# Patient Record
Sex: Female | Born: 1980 | Race: White | Hispanic: No | Marital: Married | Smoking: Never smoker
Health system: Southern US, Community
[De-identification: ages and names within clinical notes are randomized; demographics above are authoritative.]

## PROBLEM LIST (undated history)

## (undated) DIAGNOSIS — I10 Essential (primary) hypertension: Secondary | ICD-10-CM

## (undated) DIAGNOSIS — F419 Anxiety disorder, unspecified: Secondary | ICD-10-CM

## (undated) DIAGNOSIS — D509 Iron deficiency anemia, unspecified: Secondary | ICD-10-CM

## (undated) DIAGNOSIS — E282 Polycystic ovarian syndrome: Secondary | ICD-10-CM

## (undated) DIAGNOSIS — G4733 Obstructive sleep apnea (adult) (pediatric): Secondary | ICD-10-CM

## (undated) HISTORY — PX: LAPAROSCOPIC OOPHERECTOMY: SHX6507

## (undated) HISTORY — DX: Essential (primary) hypertension: I10

## (undated) HISTORY — DX: Obstructive sleep apnea (adult) (pediatric): G47.33

## (undated) HISTORY — PX: LAPAROSCOPIC HYSTERECTOMY: SHX1926

## (undated) HISTORY — DX: Anxiety disorder, unspecified: F41.9

## (undated) HISTORY — PX: LAPAROSCOPY: SHX197

## (undated) HISTORY — PX: NASAL SEPTUM SURGERY: SHX37

---

## 2011-11-07 ENCOUNTER — Emergency Department (HOSPITAL_COMMUNITY): Payer: PRIVATE HEALTH INSURANCE

## 2011-11-07 ENCOUNTER — Emergency Department (HOSPITAL_COMMUNITY)
Admission: EM | Admit: 2011-11-07 | Discharge: 2011-11-07 | Disposition: A | Discharge status: Home / Self Care | Payer: PRIVATE HEALTH INSURANCE | Attending: Emergency Medicine | Admitting: Emergency Medicine

## 2011-11-07 DIAGNOSIS — R059 Cough, unspecified: Secondary | ICD-10-CM | POA: Insufficient documentation

## 2011-11-07 DIAGNOSIS — J3489 Other specified disorders of nose and nasal sinuses: Secondary | ICD-10-CM | POA: Insufficient documentation

## 2011-11-07 DIAGNOSIS — H9209 Otalgia, unspecified ear: Secondary | ICD-10-CM | POA: Insufficient documentation

## 2011-11-07 DIAGNOSIS — R079 Chest pain, unspecified: Secondary | ICD-10-CM | POA: Insufficient documentation

## 2011-11-07 DIAGNOSIS — J069 Acute upper respiratory infection, unspecified: Secondary | ICD-10-CM | POA: Insufficient documentation

## 2011-11-07 DIAGNOSIS — IMO0001 Reserved for inherently not codable concepts without codable children: Secondary | ICD-10-CM | POA: Insufficient documentation

## 2011-11-07 DIAGNOSIS — M549 Dorsalgia, unspecified: Secondary | ICD-10-CM | POA: Insufficient documentation

## 2011-11-07 DIAGNOSIS — R070 Pain in throat: Secondary | ICD-10-CM | POA: Insufficient documentation

## 2011-11-07 DIAGNOSIS — R51 Headache: Secondary | ICD-10-CM | POA: Insufficient documentation

## 2011-11-07 DIAGNOSIS — R05 Cough: Secondary | ICD-10-CM | POA: Insufficient documentation

## 2011-11-07 LAB — POCT PREGNANCY, URINE: Preg Test, Ur: NEGATIVE

## 2011-11-07 MED ORDER — GUAIFENESIN-CODEINE 100-10 MG/5ML PO SYRP: ORAL_SOLUTION | ORAL | Status: DC

## 2011-11-07 NOTE — ED Provider Notes (Signed)
History     CSN: [id number]  Arrival date & time 11/07/11  1537   First MD Initiated Contact with Patient 11/07/11 1754      Chief Complaint  Patient presents with  . Nasal Congestion  . Cough  . Back Pain  . Chest Pain  . Sore Throat  . Otalgia  . Generalized Body Aches    (Consider location/radiation/quality/duration/timing/severity/associated sxs/prior treatment) Patient is a 31 y.o. female presenting with cough, back pain, chest pain, pharyngitis, and ear pain. The history is provided by the patient. No language interpreter was used.  Cough This is a new problem. The current episode started 2 days ago. The problem occurs every few minutes. The problem has been gradually worsening. The cough is productive of sputum. Associated symptoms include chest pain, ear pain, headaches, sore throat and myalgias. Pertinent negatives include no wheezing.  Back Pain  Associated symptoms include chest pain and headaches.  Chest Pain Primary symptoms include cough. Pertinent negatives for primary symptoms include no wheezing.    Sore Throat Associated symptoms include chest pain, coughing, headaches, myalgias and a sore throat.  Otalgia Associated symptoms include headaches, sore throat and cough.    History reviewed. No pertinent past medical history.  History reviewed. No pertinent past surgical history.  No family history on file.  History  Substance Use Topics  . Smoking status: Never Smoker   . Smokeless tobacco: Not on file  . Alcohol Use: No    OB History    Grav Para Term Preterm Abortions TAB SAB Ect Mult Living                  Review of Systems  HENT: Positive for ear pain and sore throat.   Respiratory: Positive for cough. Negative for wheezing.   Cardiovascular: Positive for chest pain.  Musculoskeletal: Positive for myalgias and back pain.  Neurological: Positive for headaches.    Allergies  Latex  Home Medications  No current outpatient  prescriptions on file.  BP 133/91  Pulse 88  Temp(Src) 98 F (36.7 C) (Oral)  Resp 20  Ht 5\' 9"  (1.753 m)  Wt 255 lb (115.667 kg)  BMI 37.66 kg/m2  SpO2 100%  LMP 10/25/2011  Physical Exam  Nursing note and vitals reviewed. Constitutional: She is oriented to person, place, and time. She appears well-developed and well-nourished. She is cooperative. No distress.  HENT:  Head: Normocephalic and atraumatic.  Right Ear: External ear normal.  Left Ear: External ear normal.  Mouth/Throat: Mucous membranes are normal. No dental abscesses or uvula swelling. Posterior oropharyngeal erythema present. No oropharyngeal exudate or tonsillar abscesses.  Eyes: EOM are normal.  Neck: Normal range of motion.  Cardiovascular: Normal rate, regular rhythm and normal heart sounds.   Pulmonary/Chest: Effort normal and breath sounds normal. No accessory muscle usage. No respiratory distress. She has no decreased breath sounds. She has no wheezes. She has no rhonchi. She has no rales.  Abdominal: Soft. She exhibits no distension. There is no tenderness.  Musculoskeletal: Normal range of motion.       Arms: Neurological: She is alert and oriented to person, place, and time.  Skin: Skin is warm and dry.  Psychiatric: She has a normal mood and affect. Judgment normal.    ED Course  Procedures (including critical care time)  Labs Reviewed - No data to display No results found.   No diagnosis found.    MDM          Duke Salvia  [place], [place] 11/07/11 1857

## 2011-11-07 NOTE — Discharge Instructions (Signed)
Antibiotic Nonuse  Your caregiver felt that the infection or problem was not one that would be helped with an antibiotic. Infections may be caused by viruses or bacteria. Only a caregiver can tell which one of these is the likely cause of an illness. A cold is the most common cause of infection in both adults and children. A cold is a virus. Antibiotic treatment will have no effect on a viral infection. Viruses can lead to many lost days of work caring for sick children and many missed days of school. Children may catch as many as 10 "colds" or "flus" per year during which they can be tearful, cranky, and uncomfortable. The goal of treating a virus is aimed at keeping the ill person comfortable. Antibiotics are medications used to help the body fight bacterial infections. There are relatively few types of bacteria that cause infections but there are hundreds of viruses. While both viruses and bacteria cause infection they are very different types of germs. A viral infection will typically go away by itself within 7 to 10 days. Bacterial infections may spread or get worse without antibiotic treatment. Examples of bacterial infections are:  Sore throats (like strep throat or tonsillitis).   Infection in the lung (pneumonia).   Ear and skin infections.  Examples of viral infections are:  Colds or flus.   Most coughs and bronchitis.   Sore throats not caused by Strep.   Runny noses.  It is often best not to take an antibiotic when a viral infection is the cause of the problem. Antibiotics can kill off the helpful bacteria that we have inside our body and allow harmful bacteria to start growing. Antibiotics can cause side effects such as allergies, nausea, and diarrhea without helping to improve the symptoms of the viral infection. Additionally, repeated uses of antibiotics can cause bacteria inside of our body to become resistant. That resistance can be passed onto harmful bacterial. The next time  you have an infection it may be harder to treat if antibiotics are used when they are not needed. Not treating with antibiotics allows our own immune system to develop and take care of infections more efficiently. Also, antibiotics will work better for [place] when they are prescribed for bacterial infections. Treatments for a child that is ill may include:  Give extra fluids throughout the day to stay hydrated.   Get plenty of rest.   Only give your child over-the-counter or prescription medicines for pain, discomfort, or fever as directed by your caregiver.   The use of a cool mist humidifier may help stuffy noses.   Cold medications if suggested by your caregiver.  Your caregiver may decide to start you on an antibiotic if:  The problem you were seen for today continues for a longer length of time than expected.   You develop a secondary bacterial infection.  SEEK MEDICAL CARE IF:  Fever lasts longer than 5 days.   Symptoms continue to get worse after 5 to 7 days or become severe.   Difficulty in breathing develops.   Signs of dehydration develop (poor drinking, rare urinating, dark colored urine).   Changes in behavior or worsening tiredness (listlessness or lethargy).  Document Released: 12/27/2001 Document Revised: 06/30/2011 Document Reviewed: 06/25/2009 [facility] Patient Information 2012 [place], [place].Upper Respiratory Infection, Adult An upper respiratory infection (URI) is also known as the common cold. It is often caused by a type of germ (virus). Colds are easily spread (contagious). You can pass it to others by  kissing, coughing, sneezing, or drinking out of the same glass. Usually, you get better in 1 or 2 weeks.  HOME CARE   Only take medicine as told by your doctor.   Use a warm mist humidifier or breathe in steam from a hot shower.   Drink enough water and fluids to keep your pee (urine) clear or pale yellow.   Get plenty of rest.   Return to work when your  temperature is back to normal or as told by your doctor. You may use a face mask and wash your hands to stop your cold from spreading.  GET HELP RIGHT AWAY IF:   After the first few days, you feel you are getting worse.   You have questions about your medicine.   You have chills, shortness of breath, or brown or red spit (mucus).   You have yellow or brown snot (nasal discharge) or pain in the face, especially when you bend forward.   You have a fever, puffy (swollen) neck, pain when you swallow, or white spots in the back of your throat.   You have a bad headache, ear pain, sinus pain, or chest pain.   You have a high-pitched whistling sound when you breathe in and out (wheezing).   You have a lasting cough or cough up blood.   You have sore muscles or a stiff neck.  MAKE SURE YOU:   Understand these instructions.   Will watch your condition.   Will get help right away if you are not doing well or get worse.  Document Released: 04/05/2008 Document Revised: 06/30/2011 Document Reviewed: 02/22/2011 [facility] Patient Information 2012 [place], [place].    Take the cough medicine as directed.  Take tylenol up to 1000 mg every 4 hrs or ibuprofen up to 800 mg every 8 hrs for fever or discomfort.  Follow up with your MD as needed.

## 2011-11-07 NOTE — ED Notes (Signed)
Pt presents with nasal and chest congestion, cough, sore throat, ear pain, body aches, and back/ chest pain when she coughs since Friday.

## 2011-11-09 NOTE — ED Provider Notes (Signed)
Medical screening examination/treatment/procedure(s) were performed by non-physician practitioner and as supervising physician I was immediately available for consultation/collaboration.  Vickie Ruben S. Malikiah Debarr, MD 11/09/11 1000 

## 2013-09-09 ENCOUNTER — Emergency Department (HOSPITAL_COMMUNITY)
Admission: EM | Admit: 2013-09-09 | Discharge: 2013-09-09 | Disposition: A | Discharge status: Home / Self Care | Payer: BC Managed Care – PPO | Attending: Emergency Medicine | Admitting: Emergency Medicine

## 2013-09-09 ENCOUNTER — Encounter (HOSPITAL_COMMUNITY): Payer: Self-pay | Admitting: Emergency Medicine

## 2013-09-09 ENCOUNTER — Emergency Department (HOSPITAL_COMMUNITY): Payer: BC Managed Care – PPO

## 2013-09-09 DIAGNOSIS — R000 Tachycardia, unspecified: Secondary | ICD-10-CM | POA: Insufficient documentation

## 2013-09-09 DIAGNOSIS — N946 Dysmenorrhea, unspecified: Secondary | ICD-10-CM

## 2013-09-09 DIAGNOSIS — Z862 Personal history of diseases of the blood and blood-forming organs and certain disorders involving the immune mechanism: Secondary | ICD-10-CM | POA: Insufficient documentation

## 2013-09-09 DIAGNOSIS — Z91040 Latex allergy status: Secondary | ICD-10-CM | POA: Insufficient documentation

## 2013-09-09 DIAGNOSIS — Z3202 Encounter for pregnancy test, result negative: Secondary | ICD-10-CM | POA: Insufficient documentation

## 2013-09-09 DIAGNOSIS — Z79899 Other long term (current) drug therapy: Secondary | ICD-10-CM | POA: Insufficient documentation

## 2013-09-09 DIAGNOSIS — Z792 Long term (current) use of antibiotics: Secondary | ICD-10-CM | POA: Insufficient documentation

## 2013-09-09 DIAGNOSIS — R197 Diarrhea, unspecified: Secondary | ICD-10-CM | POA: Insufficient documentation

## 2013-09-09 HISTORY — DX: Iron deficiency anemia, unspecified: D50.9

## 2013-09-09 LAB — URINALYSIS, ROUTINE W REFLEX MICROSCOPIC
Bilirubin Urine: NEGATIVE
Glucose, UA: NEGATIVE mg/dL
Ketones, ur: NEGATIVE mg/dL
pH: 5.5 (ref 5.0–8.0)

## 2013-09-09 LAB — URINE MICROSCOPIC-ADD ON

## 2013-09-09 LAB — CBC WITH DIFFERENTIAL/PLATELET
Eosinophils Absolute: 0.2 10*3/uL (ref 0.0–0.7)
Hemoglobin: 11.8 g/dL — ABNORMAL LOW (ref 12.0–15.0)
Lymphs Abs: 2.7 10*3/uL (ref 0.7–4.0)
MCH: 26.7 pg (ref 26.0–34.0)
Neutro Abs: 6 10*3/uL (ref 1.7–7.7)
Neutrophils Relative %: 64 % (ref 43–77)
Platelets: 335 10*3/uL (ref 150–400)
RBC: 4.42 MIL/uL (ref 3.87–5.11)
WBC: 9.4 10*3/uL (ref 4.0–10.5)

## 2013-09-09 LAB — COMPREHENSIVE METABOLIC PANEL
ALT: 15 U/L (ref 0–35)
Albumin: 3.6 g/dL (ref 3.5–5.2)
Alkaline Phosphatase: 78 U/L (ref 39–117)
Chloride: 102 mEq/L (ref 96–112)
Glucose, Bld: 96 mg/dL (ref 70–99)
Potassium: 3.8 mEq/L (ref 3.5–5.1)
Sodium: 137 mEq/L (ref 135–145)
Total Bilirubin: 0.2 mg/dL — ABNORMAL LOW (ref 0.3–1.2)
Total Protein: 7.5 g/dL (ref 6.0–8.3)

## 2013-09-09 MED ORDER — SODIUM CHLORIDE 0.9 % IV BOLUS (SEPSIS)
1000.0000 mL | Freq: Once | INTRAVENOUS | Status: AC
  Administered 2013-09-09: 1000 mL via INTRAVENOUS

## 2013-09-09 NOTE — ED Provider Notes (Signed)
CSN: [id number]     Arrival date & time 09/09/13  1901 History   First MD Initiated Contact with Patient 09/09/13 1959     Chief Complaint  Patient presents with  . Vaginal Bleeding   (Consider location/radiation/quality/duration/timing/severity/associated sxs/prior Treatment) HPI Comments: Patient reports, one week of heavy menstrual bleeding associated with clots.  She also has been taking antibiotic for the past, week for sinus infection, and is developed diarrhea.  She is unsure whether the abdominal pain is from the, diarrhea, or from her menstrual cycle.  She is currently taking birth control pills for the past 10 months.  She recently has been married and started having intercourse on a regular basis she denies any dysuria, or vaginal discharge.  Prior to the vaginal bleeding.  She has not contacted her OB/GYN for the symptoms. Denies any change in her appetite, dizziness, or symptoms of anemia, such as rapid heart rate, or weakness  Patient is a 32 y.o. female presenting with vaginal bleeding. The history is provided by the patient.  Vaginal Bleeding Quality:  Clots Severity:  Moderate Onset quality:  Gradual Duration:  1 week Timing:  Intermittent Progression:  Unchanged Chronicity:  New Menstrual history:  Regular Number of pads used:  One pad every 3-4, hours Possible pregnancy: no   Context comment:  Normal menstrual cycle Relieved by:  Nothing Worsened by:  Nothing tried Associated symptoms: abdominal pain   Associated symptoms: no dizziness, no dysuria, no fever and no vaginal discharge   Associated symptoms comment:  Diarrhea Risk factors: new sexual partner   Risk factors: no bleeding disorder, no hx of ectopic pregnancy, no hx of endometriosis, no ovarian cysts and no prior miscarriage     Past Medical History  Diagnosis Date  . Iron deficiency anemia    Past Surgical History  Procedure Laterality Date  . Laparoscopy     No family history on file. History   Substance Use Topics  . Smoking status: Never Smoker   . Smokeless tobacco: Not on file  . Alcohol Use: No   OB History   Grav Para Term Preterm Abortions TAB SAB Ect Mult Living                 Review of Systems  Constitutional: Negative for fever and chills.  Respiratory: Negative for shortness of breath.   Cardiovascular: Negative for leg swelling.  Gastrointestinal: Positive for abdominal pain and diarrhea.  Genitourinary: Positive for vaginal bleeding. Negative for dysuria, flank pain, vaginal discharge and vaginal pain.  Neurological: Negative for dizziness and headaches.  All other systems reviewed and are negative.    Allergies  Latex  Home Medications   Current Outpatient Rx  Name  Route  Sig  Dispense  Refill  . amoxicillin (AMOXIL) 500 MG capsule   Oral   Take 500 mg by mouth 2 (two) times daily.         Marland Kitchen ibuprofen (ADVIL,MOTRIN) 200 MG tablet   Oral   Take 800 mg by mouth every 6 (six) hours as needed for moderate pain.         Marland Kitchen loratadine (ALLERGY RELIEF) 10 MG tablet   Oral   Take 10 mg by mouth daily as needed for allergies.         . Multiple Vitamin (MULTIVITAMIN WITH MINERALS) TABS tablet   Oral   Take 1 tablet by mouth daily.         Lorita Officer Triphasic (ORTHO TRI-CYCLEN, 28, PO)  Oral   Take 1 tablet by mouth daily.          BP 137/89  Pulse 85  Temp(Src) 97.9 F (36.6 C) (Oral)  Resp 20  Wt 307 lb (139.254 kg)  SpO2 99%  LMP 09/02/2013 Physical Exam  Nursing note and vitals reviewed. Constitutional: She appears well-developed and well-nourished.  HENT:  Head: Normocephalic.  Eyes: Pupils are equal, round, and reactive to light.  Neck: Normal range of motion.  Cardiovascular: Regular rhythm.  Tachycardia present.   Pulmonary/Chest: Effort normal and breath sounds normal.  Abdominal: Soft. She exhibits no distension. There is no tenderness.  Abdominal exam difficult to do, to body habitus/morbid obesity   Genitourinary: Uterus is deviated. Uterus is not tender. Cervix exhibits no discharge. Right adnexum displays no tenderness. Left adnexum displays no tenderness.  Cervical os appears to be open approximately one fingertip.  Bright red blood and clots in the vaginal vault  Musculoskeletal: Normal range of motion.  Neurological: She is alert.  Skin: Skin is warm.    ED Course  Procedures (including critical care time) Labs Review Labs Reviewed  CBC WITH DIFFERENTIAL - Abnormal; Notable for the following:    Hemoglobin 11.8 (*)    All other components within normal limits  COMPREHENSIVE METABOLIC PANEL - Abnormal; Notable for the following:    Total Bilirubin 0.2 (*)    All other components within normal limits  URINALYSIS, ROUTINE W REFLEX MICROSCOPIC - Abnormal; Notable for the following:    Color, Urine AMBER (*)    APPearance CLOUDY (*)    Hgb urine dipstick LARGE (*)    Leukocytes, UA TRACE (*)    All other components within normal limits  URINE MICROSCOPIC-ADD ON  POCT PREGNANCY, URINE   Imaging Review US Transvaginal Non-ob  09/09/2013   CLINICAL DATA:  Vaginal bleeding.  EXAM: TRANSABDOMINAL AND TRANSVAGINAL ULTRASOUND OF PELVIS  TECHNIQUE: Both transabdominal and transvaginal ultrasound examinations of the pelvis were performed. Transabdominal technique was performed for global imaging of the pelvis including uterus, ovaries, adnexal regions, and pelvic cul-de-sac. It was necessary to proceed with endovaginal exam following the transabdominal exam to visualize the uterus and ovaries in better detail.  COMPARISON:  None  FINDINGS: Uterus  Measurements: 9.6 x 6.0 x 4.6 cm. No fibroids or other mass visualized.  Endometrium  Thickness: 3.2 mm.  No focal abnormality visualized.  Right ovary  Measurements: 6.1 x 4.9 x 3.7 cm. 4.9 cm simple appearing cyst.  Left ovary  Measurements: 4.1 x 1.9 x 1.8 cm. Normal appearance/no adnexal mass.  Other findings  Trace amount of free peritoneal  fluid, within normal limits of physiological fluid.  IMPRESSION: 4.9 cm simple appearing right ovarian cyst. This is almost certainly benign, and no specific imaging follow up is recommended according to the Society of Radiologists in Ultrasound 2010 Consensus Conference Statement (D Lenis Noon et al. Management of Asymptomatic Ovarian and Other Adnexal Cysts Imaged at [place]: Society of Radiologists in Ultrasound Consensus Conference Statement 2010. Radiology 256 (Sept 2010): 943-954.). Otherwise, unremarkable examination.   Electronically Signed   By: Gordan Payment M.D.   On: 09/09/2013 22:03   US Pelvis Complete  09/09/2013   CLINICAL DATA:  Vaginal bleeding.  EXAM: TRANSABDOMINAL AND TRANSVAGINAL ULTRASOUND OF PELVIS  TECHNIQUE: Both transabdominal and transvaginal ultrasound examinations of the pelvis were performed. Transabdominal technique was performed for global imaging of the pelvis including uterus, ovaries, adnexal regions, and pelvic cul-de-sac. It was necessary to proceed with endovaginal exam following  the transabdominal exam to visualize the uterus and ovaries in better detail.  COMPARISON:  None  FINDINGS: Uterus  Measurements: 9.6 x 6.0 x 4.6 cm. No fibroids or other mass visualized.  Endometrium  Thickness: 3.2 mm.  No focal abnormality visualized.  Right ovary  Measurements: 6.1 x 4.9 x 3.7 cm. 4.9 cm simple appearing cyst.  Left ovary  Measurements: 4.1 x 1.9 x 1.8 cm. Normal appearance/no adnexal mass.  Other findings  Trace amount of free peritoneal fluid, within normal limits of physiological fluid.  IMPRESSION: 4.9 cm simple appearing right ovarian cyst. This is almost certainly benign, and no specific imaging follow up is recommended according to the Society of Radiologists in Ultrasound 2010 Consensus Conference Statement (D Lenis Noon et al. Management of Asymptomatic Ovarian and Other Adnexal Cysts Imaged at [place]: Society of Radiologists in Ultrasound Consensus Conference Statement 2010. Radiology  256 (Sept 2010): 943-954.). Otherwise, unremarkable examination.   Electronically Signed   By: Gordan Payment M.D.   On: 09/09/2013 22:03    EKG Interpretation   None       MDM   1. Diarrhea   2. Dysmenorrhea     Hemoglobin and hematocrit within normal range.  White count.  Does not reveal any infection.  Will obtain ultrasound due to patient's bleeding and cervical os being open  She was given a liter of fluid.  Her ultrasound was reviewed and is continue taking her birth control.  Warm to add probiotic to her diary Carney Bern while she completes her antibiotic, and to make an appointment with her primary care OB/GYN for further evaluation.  As needed  Arman Filter, NP 09/09/13 2211

## 2013-09-09 NOTE — ED Notes (Signed)
Patient transported to Ultrasound 

## 2013-09-09 NOTE — ED Notes (Signed)
Pt. reports heavy menstrual bleeding with clots and low abdominal cramping for 1 week .

## 2013-09-09 NOTE — ED Notes (Signed)
Schulz, NP at bedside.  

## 2013-09-09 NOTE — ED Notes (Signed)
Patient returned from Ultrasound. 

## 2013-09-10 NOTE — ED Provider Notes (Signed)
Medical screening examination/treatment/procedure(s) were performed by non-physician practitioner and as supervising physician I was immediately available for consultation/collaboration.  EKG Interpretation   None         Darlys Gales, MD 09/10/13 [phone]

## 2015-07-14 ENCOUNTER — Other Ambulatory Visit (HOSPITAL_COMMUNITY): Payer: Self-pay | Admitting: Obstetrics & Gynecology

## 2015-07-14 DIAGNOSIS — Z3141 Encounter for fertility testing: Secondary | ICD-10-CM

## 2015-07-18 ENCOUNTER — Ambulatory Visit (HOSPITAL_COMMUNITY): Payer: PRIVATE HEALTH INSURANCE

## 2015-11-30 ENCOUNTER — Inpatient Hospital Stay (HOSPITAL_COMMUNITY)
Admission: AD | Admit: 2015-11-30 | Discharge: 2015-12-01 | Disposition: A | Discharge status: Home / Self Care | Payer: BLUE CROSS/BLUE SHIELD | Source: Ambulatory Visit | Attending: Obstetrics and Gynecology | Admitting: Obstetrics and Gynecology

## 2015-11-30 DIAGNOSIS — R030 Elevated blood-pressure reading, without diagnosis of hypertension: Secondary | ICD-10-CM | POA: Insufficient documentation

## 2015-11-30 DIAGNOSIS — N939 Abnormal uterine and vaginal bleeding, unspecified: Secondary | ICD-10-CM | POA: Diagnosis present

## 2015-11-30 DIAGNOSIS — E282 Polycystic ovarian syndrome: Secondary | ICD-10-CM | POA: Insufficient documentation

## 2015-11-30 HISTORY — DX: Polycystic ovarian syndrome: E28.2

## 2015-11-30 LAB — CBC
HCT: 37 % (ref 36.0–46.0)
Hemoglobin: 11.5 g/dL — ABNORMAL LOW (ref 12.0–15.0)
MCH: 25.4 pg — AB (ref 26.0–34.0)
MCHC: 31.1 g/dL (ref 30.0–36.0)
MCV: 81.9 fL (ref 78.0–100.0)
PLATELETS: 267 10*3/uL (ref 150–400)
RBC: 4.52 MIL/uL (ref 3.87–5.11)
RDW: 15.8 % — ABNORMAL HIGH (ref 11.5–15.5)
WBC: 7.8 10*3/uL (ref 4.0–10.5)

## 2015-11-30 LAB — URINALYSIS, ROUTINE W REFLEX MICROSCOPIC
BILIRUBIN URINE: NEGATIVE
GLUCOSE, UA: NEGATIVE mg/dL
KETONES UR: 15 mg/dL — AB
Nitrite: NEGATIVE
PH: 5.5 (ref 5.0–8.0)
Protein, ur: NEGATIVE mg/dL
SPECIFIC GRAVITY, URINE: 1.025 (ref 1.005–1.030)

## 2015-11-30 LAB — URINE MICROSCOPIC-ADD ON

## 2015-11-30 LAB — POCT PREGNANCY, URINE: PREG TEST UR: NEGATIVE

## 2015-11-30 NOTE — MAU Note (Signed)
Pt states that she has PCOS and started having heavy vaginal bleeding. Takes provera. States the bleeding is much worse than it normally is and she is passing some clots. Wears double pads and states that she changes them 6-7 times/day. Taking iron supplement.

## 2015-12-01 ENCOUNTER — Encounter (HOSPITAL_COMMUNITY): Payer: Self-pay

## 2015-12-01 DIAGNOSIS — N939 Abnormal uterine and vaginal bleeding, unspecified: Secondary | ICD-10-CM | POA: Diagnosis not present

## 2015-12-01 NOTE — Discharge Instructions (Signed)

## 2015-12-01 NOTE — MAU Provider Note (Signed)
History   [id number]   Chief Complaint  Patient presents with  . Vaginal Bleeding    HPI Vickie Mason is a 35 y.o. female  [id number] here with report of heavy vaginal bleeding x 2 weeks.  Pt states that she has PCOS and takes provera 10 mg daily to help with the bleeding. Patient reports that the bleeding is much worse than it normally is and she is passing some clots. + uterine cramping.  Wears double pads and states that she changes them 6-7 times/day. Taking iron supplement.  +fatigue, denies dizziness, chest pain, or palpitations.   Pt declines pain medication at this time.    Patient's last menstrual period was 11/17/2015.  OB History  Gravida Para Term Preterm AB SAB TAB Ectopic Multiple Living         Past Medical History  Diagnosis Date  . Iron deficiency anemia   . PCOS (polycystic ovarian syndrome)     No family history on file.  Social History   Social History  . Marital Status: Married    Spouse Name: N/A  . Number of Children: N/A  . Years of Education: N/A   Social History Main Topics  . Smoking status: Never Smoker   . Smokeless tobacco: None  . Alcohol Use: No  . Drug Use: No  . Sexual Activity: Yes    Birth Control/ Protection: None   Other Topics Concern  . None   Social History Narrative    Allergies  Allergen Reactions  . Latex Dermatitis    No current facility-administered medications on file prior to encounter.   Current Outpatient Prescriptions on File Prior to Encounter  Medication Sig Dispense Refill  . amoxicillin (AMOXIL) 500 MG capsule Take 500 mg by mouth 2 (two) times daily.    Marland Kitchen ibuprofen (ADVIL,MOTRIN) 200 MG tablet Take 800 mg by mouth every 6 (six) hours as needed for moderate pain.    Marland Kitchen loratadine (ALLERGY RELIEF) 10 MG tablet Take 10 mg by mouth daily as needed for allergies.    . Multiple Vitamin (MULTIVITAMIN WITH MINERALS) TABS tablet Take 1 tablet by mouth daily.    Lorita Officer  Triphasic (ORTHO TRI-CYCLEN, 28, PO) Take 1 tablet by mouth daily.       Review of Systems  Constitutional: Positive for fatigue. Negative for fever and chills.  Cardiovascular: Negative for chest pain.  Genitourinary: Positive for vaginal bleeding and pelvic pain.  Neurological: Negative for dizziness, syncope and light-headedness.  All other systems reviewed and are negative.    Physical Exam   Filed Vitals:   11/30/15 2305  BP: 160/82  Pulse: 73  Temp: 98.1 F (36.7 C)  Resp: 20  Height:  (1.727 m)  Weight: 154.495 kg (340 lb 9.6 oz)  SpO2: 99%    Physical Exam  Constitutional: She is oriented to person, place, and time. She appears well-developed and well-nourished.  HENT:  Head: Normocephalic.  Neck: Normal range of motion. Neck supple.  Cardiovascular: Normal rate, regular rhythm and normal heart sounds.   Respiratory: Effort normal and breath sounds normal.  GI: Soft. She exhibits no mass. There is tenderness. There is no guarding.  Genitourinary: There is bleeding (small amount of bleeding; negative clots ) in the vagina.  Neurological: She is alert and oriented to person, place, and time. She has normal reflexes.  Skin: Skin is warm and dry.    MAU Course  Procedures  MDM Results for orders placed or performed during the hospital encounter of 11/30/15 (from the past 24 hour(s))  Urinalysis, Routine w reflex microscopic (not at [facility])     Status: Abnormal   Collection Time: 11/30/15 11:11 PM  Result Value Ref Range   Color, Urine YELLOW YELLOW   APPearance CLOUDY (A) CLEAR   Specific Gravity, Urine 1.025 1.005 - 1.030   pH 5.5 5.0 - 8.0   Glucose, UA NEGATIVE NEGATIVE mg/dL   Hgb urine dipstick LARGE (A) NEGATIVE   Bilirubin Urine NEGATIVE NEGATIVE   Ketones, ur 15 (A) NEGATIVE mg/dL   Protein, ur NEGATIVE NEGATIVE mg/dL   Nitrite NEGATIVE NEGATIVE   Leukocytes, UA TRACE (A) NEGATIVE  Urine microscopic-add on     Status: Abnormal   Collection  Time: 11/30/15 11:11 PM  Result Value Ref Range   Squamous Epithelial / LPF 0-5 (A) NONE SEEN   WBC, UA 0-5 0 - 5 WBC/hpf   RBC / HPF TOO NUMEROUS TO COUNT 0 - 5 RBC/hpf   Bacteria, UA RARE (A) NONE SEEN  CBC     Status: Abnormal   Collection Time: 11/30/15 11:30 PM  Result Value Ref Range   WBC 7.8 4.0 - 10.5 K/uL   RBC 4.52 3.87 - 5.11 MIL/uL   Hemoglobin 11.5 (L) 12.0 - 15.0 g/dL   HCT 98.1 19.1 - 47.8 %   MCV 81.9 78.0 - 100.0 fL   MCH 25.4 (L) 26.0 - 34.0 pg   MCHC 31.1 30.0 - 36.0 g/dL   RDW 29.5 (H) 62.1 - 30.8 %   Platelets 267 150 - 400 K/uL  Pregnancy, urine POC     Status: None   Collection Time: 11/30/15 11:30 PM  Result Value Ref Range   Preg Test, Ur NEGATIVE NEGATIVE   0115 Consulted with Dr. Vincente Poli > Reviewed HPI/Exam/labs/vitals > discharge home with patient follow-up in office, call for an appointment   Assessment and Plan  Abnormal Uterine Bleeding Elevated Blood Pressure  Plan: Discharge home Call office in AM to schedule appt   Marlis Edelson, CNM 12/01/2015 1:16 AM

## 2016-03-04 DIAGNOSIS — Z6841 Body Mass Index (BMI) 40.0 and over, adult: Secondary | ICD-10-CM | POA: Diagnosis not present

## 2016-03-04 DIAGNOSIS — N926 Irregular menstruation, unspecified: Secondary | ICD-10-CM | POA: Diagnosis not present

## 2016-03-04 DIAGNOSIS — E6609 Other obesity due to excess calories: Secondary | ICD-10-CM | POA: Diagnosis not present

## 2016-04-08 DIAGNOSIS — E282 Polycystic ovarian syndrome: Secondary | ICD-10-CM | POA: Diagnosis not present

## 2016-04-08 DIAGNOSIS — Z6841 Body Mass Index (BMI) 40.0 and over, adult: Secondary | ICD-10-CM | POA: Diagnosis not present

## 2016-04-08 DIAGNOSIS — R634 Abnormal weight loss: Secondary | ICD-10-CM | POA: Diagnosis not present

## 2016-04-08 DIAGNOSIS — Z713 Dietary counseling and surveillance: Secondary | ICD-10-CM | POA: Diagnosis not present

## 2016-05-12 DIAGNOSIS — M62838 Other muscle spasm: Secondary | ICD-10-CM | POA: Diagnosis not present

## 2016-05-12 DIAGNOSIS — M542 Cervicalgia: Secondary | ICD-10-CM | POA: Diagnosis not present

## 2016-05-12 DIAGNOSIS — M9901 Segmental and somatic dysfunction of cervical region: Secondary | ICD-10-CM | POA: Diagnosis not present

## 2016-05-12 DIAGNOSIS — M5413 Radiculopathy, cervicothoracic region: Secondary | ICD-10-CM | POA: Diagnosis not present

## 2016-05-13 DIAGNOSIS — M62838 Other muscle spasm: Secondary | ICD-10-CM | POA: Diagnosis not present

## 2016-05-13 DIAGNOSIS — M542 Cervicalgia: Secondary | ICD-10-CM | POA: Diagnosis not present

## 2016-05-13 DIAGNOSIS — M9901 Segmental and somatic dysfunction of cervical region: Secondary | ICD-10-CM | POA: Diagnosis not present

## 2016-05-13 DIAGNOSIS — M5413 Radiculopathy, cervicothoracic region: Secondary | ICD-10-CM | POA: Diagnosis not present

## 2016-05-17 DIAGNOSIS — M542 Cervicalgia: Secondary | ICD-10-CM | POA: Diagnosis not present

## 2016-05-17 DIAGNOSIS — M62838 Other muscle spasm: Secondary | ICD-10-CM | POA: Diagnosis not present

## 2016-05-17 DIAGNOSIS — M9901 Segmental and somatic dysfunction of cervical region: Secondary | ICD-10-CM | POA: Diagnosis not present

## 2016-05-17 DIAGNOSIS — M5413 Radiculopathy, cervicothoracic region: Secondary | ICD-10-CM | POA: Diagnosis not present

## 2016-05-18 DIAGNOSIS — M9901 Segmental and somatic dysfunction of cervical region: Secondary | ICD-10-CM | POA: Diagnosis not present

## 2016-05-18 DIAGNOSIS — M542 Cervicalgia: Secondary | ICD-10-CM | POA: Diagnosis not present

## 2016-05-18 DIAGNOSIS — M62838 Other muscle spasm: Secondary | ICD-10-CM | POA: Diagnosis not present

## 2016-05-18 DIAGNOSIS — M5413 Radiculopathy, cervicothoracic region: Secondary | ICD-10-CM | POA: Diagnosis not present

## 2016-05-19 DIAGNOSIS — M9901 Segmental and somatic dysfunction of cervical region: Secondary | ICD-10-CM | POA: Diagnosis not present

## 2016-05-19 DIAGNOSIS — M62838 Other muscle spasm: Secondary | ICD-10-CM | POA: Diagnosis not present

## 2016-05-19 DIAGNOSIS — M542 Cervicalgia: Secondary | ICD-10-CM | POA: Diagnosis not present

## 2016-05-19 DIAGNOSIS — M5413 Radiculopathy, cervicothoracic region: Secondary | ICD-10-CM | POA: Diagnosis not present

## 2016-05-20 DIAGNOSIS — M5413 Radiculopathy, cervicothoracic region: Secondary | ICD-10-CM | POA: Diagnosis not present

## 2016-05-20 DIAGNOSIS — M9901 Segmental and somatic dysfunction of cervical region: Secondary | ICD-10-CM | POA: Diagnosis not present

## 2016-05-20 DIAGNOSIS — M542 Cervicalgia: Secondary | ICD-10-CM | POA: Diagnosis not present

## 2016-05-20 DIAGNOSIS — M62838 Other muscle spasm: Secondary | ICD-10-CM | POA: Diagnosis not present

## 2016-05-24 DIAGNOSIS — M5413 Radiculopathy, cervicothoracic region: Secondary | ICD-10-CM | POA: Diagnosis not present

## 2016-05-24 DIAGNOSIS — M9901 Segmental and somatic dysfunction of cervical region: Secondary | ICD-10-CM | POA: Diagnosis not present

## 2016-05-24 DIAGNOSIS — M542 Cervicalgia: Secondary | ICD-10-CM | POA: Diagnosis not present

## 2016-05-24 DIAGNOSIS — M62838 Other muscle spasm: Secondary | ICD-10-CM | POA: Diagnosis not present

## 2016-05-25 DIAGNOSIS — M542 Cervicalgia: Secondary | ICD-10-CM | POA: Diagnosis not present

## 2016-05-25 DIAGNOSIS — M9901 Segmental and somatic dysfunction of cervical region: Secondary | ICD-10-CM | POA: Diagnosis not present

## 2016-05-25 DIAGNOSIS — M5413 Radiculopathy, cervicothoracic region: Secondary | ICD-10-CM | POA: Diagnosis not present

## 2016-05-25 DIAGNOSIS — M62838 Other muscle spasm: Secondary | ICD-10-CM | POA: Diagnosis not present

## 2016-05-26 DIAGNOSIS — M62838 Other muscle spasm: Secondary | ICD-10-CM | POA: Diagnosis not present

## 2016-05-26 DIAGNOSIS — M5413 Radiculopathy, cervicothoracic region: Secondary | ICD-10-CM | POA: Diagnosis not present

## 2016-05-26 DIAGNOSIS — M9901 Segmental and somatic dysfunction of cervical region: Secondary | ICD-10-CM | POA: Diagnosis not present

## 2016-05-26 DIAGNOSIS — M542 Cervicalgia: Secondary | ICD-10-CM | POA: Diagnosis not present

## 2016-06-01 DIAGNOSIS — M62838 Other muscle spasm: Secondary | ICD-10-CM | POA: Diagnosis not present

## 2016-06-01 DIAGNOSIS — M5413 Radiculopathy, cervicothoracic region: Secondary | ICD-10-CM | POA: Diagnosis not present

## 2016-06-01 DIAGNOSIS — M542 Cervicalgia: Secondary | ICD-10-CM | POA: Diagnosis not present

## 2016-06-01 DIAGNOSIS — M9901 Segmental and somatic dysfunction of cervical region: Secondary | ICD-10-CM | POA: Diagnosis not present

## 2016-06-02 DIAGNOSIS — M9901 Segmental and somatic dysfunction of cervical region: Secondary | ICD-10-CM | POA: Diagnosis not present

## 2016-06-02 DIAGNOSIS — M62838 Other muscle spasm: Secondary | ICD-10-CM | POA: Diagnosis not present

## 2016-06-02 DIAGNOSIS — M5413 Radiculopathy, cervicothoracic region: Secondary | ICD-10-CM | POA: Diagnosis not present

## 2016-06-02 DIAGNOSIS — M542 Cervicalgia: Secondary | ICD-10-CM | POA: Diagnosis not present

## 2016-07-08 DIAGNOSIS — J020 Streptococcal pharyngitis: Secondary | ICD-10-CM | POA: Diagnosis not present

## 2016-07-10 ENCOUNTER — Emergency Department (HOSPITAL_COMMUNITY)
Admission: EM | Admit: 2016-07-10 | Discharge: 2016-07-10 | Disposition: A | Discharge status: Home / Self Care | Payer: BLUE CROSS/BLUE SHIELD | Attending: Emergency Medicine | Admitting: Emergency Medicine

## 2016-07-10 ENCOUNTER — Encounter (HOSPITAL_COMMUNITY): Payer: Self-pay

## 2016-07-10 ENCOUNTER — Encounter (HOSPITAL_COMMUNITY): Payer: Self-pay | Admitting: Emergency Medicine

## 2016-07-10 ENCOUNTER — Ambulatory Visit (HOSPITAL_COMMUNITY)
Admission: EM | Admit: 2016-07-10 | Discharge: 2016-07-10 | Disposition: A | Discharge status: Nursing Home (Includes ICF) | Payer: BLUE CROSS/BLUE SHIELD | Source: Home / Self Care | Attending: Internal Medicine | Admitting: Internal Medicine

## 2016-07-10 DIAGNOSIS — B084 Enteroviral vesicular stomatitis with exanthem: Secondary | ICD-10-CM | POA: Diagnosis not present

## 2016-07-10 DIAGNOSIS — Z79899 Other long term (current) drug therapy: Secondary | ICD-10-CM | POA: Diagnosis not present

## 2016-07-10 DIAGNOSIS — Z91040 Latex allergy status: Secondary | ICD-10-CM | POA: Insufficient documentation

## 2016-07-10 DIAGNOSIS — T7840XA Allergy, unspecified, initial encounter: Secondary | ICD-10-CM | POA: Diagnosis not present

## 2016-07-10 DIAGNOSIS — R21 Rash and other nonspecific skin eruption: Secondary | ICD-10-CM | POA: Diagnosis not present

## 2016-07-10 LAB — CBC WITH DIFFERENTIAL/PLATELET
Basophils Absolute: 0 10*3/uL (ref 0.0–0.1)
Basophils Relative: 0 %
Eosinophils Absolute: 0.1 10*3/uL (ref 0.0–0.7)
Eosinophils Relative: 1 %
HCT: 45.5 % (ref 36.0–46.0)
Hemoglobin: 14 g/dL (ref 12.0–15.0)
Lymphocytes Relative: 19 %
Lymphs Abs: 1.3 10*3/uL (ref 0.7–4.0)
MCH: 25.3 pg — ABNORMAL LOW (ref 26.0–34.0)
MCHC: 30.8 g/dL (ref 30.0–36.0)
MCV: 82.3 fL (ref 78.0–100.0)
Monocytes Absolute: 0.5 10*3/uL (ref 0.1–1.0)
Monocytes Relative: 7 %
Neutro Abs: 5 10*3/uL (ref 1.7–7.7)
Neutrophils Relative %: 73 %
Platelets: 309 10*3/uL (ref 150–400)
RBC: 5.53 MIL/uL — ABNORMAL HIGH (ref 3.87–5.11)
RDW: 15 % (ref 11.5–15.5)
WBC: 7 10*3/uL (ref 4.0–10.5)

## 2016-07-10 LAB — COMPREHENSIVE METABOLIC PANEL
ALK PHOS: 73 U/L (ref 38–126)
ALT: 18 U/L (ref 14–54)
AST: 14 U/L — AB (ref 15–41)
Albumin: 3.5 g/dL (ref 3.5–5.0)
Anion gap: 9 (ref 5–15)
BUN: 7 mg/dL (ref 6–20)
CALCIUM: 9 mg/dL (ref 8.9–10.3)
CHLORIDE: 104 mmol/L (ref 101–111)
CO2: 25 mmol/L (ref 22–32)
CREATININE: 0.79 mg/dL (ref 0.44–1.00)
Glucose, Bld: 101 mg/dL — ABNORMAL HIGH (ref 65–99)
Potassium: 4.1 mmol/L (ref 3.5–5.1)
Sodium: 138 mmol/L (ref 135–145)
Total Bilirubin: 0.4 mg/dL (ref 0.3–1.2)
Total Protein: 7.4 g/dL (ref 6.5–8.1)

## 2016-07-10 MED ORDER — DIPHENHYDRAMINE HCL 25 MG PO CAPS
25.0000 mg | ORAL_CAPSULE | Freq: Once | ORAL | Status: AC
  Administered 2016-07-10: 25 mg via ORAL
  Filled 2016-07-10: qty 1

## 2016-07-10 NOTE — ED Provider Notes (Signed)
[facility]-EMERGENCY DEPT Provider Note   CSN: [id number] Arrival date & time: 07/10/16  1340     History   Chief Complaint Chief Complaint  Patient presents with  . Rash    HPI Vickie Mason is a 35 y.o. female.  HPI  Vickie Mason is a previously healthy 35 year old female who presents today complaining of sore throat and fever that began on Wednesday. She was seen in her primary care office on Thursday and started empirically on amoxicillin. She began having rash on her hands and the soles of her feet yesterday. She was seen at urgent care center and sent here secondary to concerns for Stevens-Johnson's syndrome or other infection. She denies nasal congestion, cough, dyspnea, nausea, vomiting, or diarrhea.  Past Medical History:  Diagnosis Date  . Iron deficiency anemia   . PCOS (polycystic ovarian syndrome)     There are no active problems to display for this patient.   Past Surgical History:  Procedure Laterality Date  . LAPAROSCOPY      OB History    Gravida Para Term Preterm AB Living   0 0 0 0 0 0   SAB TAB Ectopic Multiple Live Births   0 0 0 0         Home Medications    Prior to Admission medications   Medication Sig Start Date End Date Taking? Authorizing Provider  amoxicillin (AMOXIL) 500 MG tablet Take 500 mg by mouth 3 (three) times daily.    Yes Historical Provider, MD  ibuprofen (ADVIL,MOTRIN) 200 MG tablet Take 800 mg by mouth every 6 (six) hours as needed for moderate pain.   Yes Historical Provider, MD  loratadine (ALLERGY RELIEF) 10 MG tablet Take 10 mg by mouth daily as needed for allergies.   Yes Historical Provider, MD  medroxyPROGESTERone (PROVERA) 10 MG tablet Take 10 mg by mouth See admin instructions. Takes from 19th-28th of each month only 06/17/16  Yes Historical Provider, MD  Multiple Vitamins-Iron (MULTIVITAMINS WITH IRON) TABS tablet Take 1 tablet by mouth daily as needed (takes occassionally).    Yes Historical Provider, MD    Family  History No family history on file.  Social History Social History  Substance Use Topics  . Smoking status: Never Smoker  . Smokeless tobacco: Never Used  . Alcohol use No     Allergies   Latex   Review of Systems Review of Systems  All other systems reviewed and are negative.    Physical Exam Updated Vital Signs BP 148/99   Pulse 89   Temp 98.5 F (36.9 C) (Oral)   Resp 25   Ht 5\' 9"  (1.753 m)   Wt (!) 144.2 kg   LMP 06/27/2016   SpO2 100%   BMI 46.96 kg/m   Physical Exam  Constitutional: She appears well-developed and well-nourished.  HENT:  Head: Normocephalic and atraumatic.  Scattered lesions on palate No exudative pharyngitis  Eyes: Conjunctivae and EOM are normal. Pupils are equal, round, and reactive to light.  Neck: Normal range of motion. Neck supple.  Cardiovascular: Normal rate, regular rhythm and normal heart sounds.   Pulmonary/Chest: Effort normal and breath sounds normal.  Abdominal: Soft. Bowel sounds are normal.  Musculoskeletal: Normal range of motion.  Skin: Skin is warm. Rash noted.  Maculopapular lesions on soles of hand and back of hand as well as on soles of feet No coalescing lesions on remainder of skin. No peeling negative Nikolsky sign  Psychiatric: She has a normal mood and affect. Her  behavior is normal. Judgment and thought content normal.  Nursing note and vitals reviewed.    ED Treatments / Results  Labs (all labs ordered are listed, but only abnormal results are displayed) Labs Reviewed  CBC WITH DIFFERENTIAL/PLATELET  COMPREHENSIVE METABOLIC PANEL    EKG  EKG Interpretation None       Radiology No results found.  Procedures Procedures (including critical care time)  Medications Ordered in ED Medications  diphenhydrAMINE (BENADRYL) capsule 25 mg (not administered)     Initial Impression / Assessment and Plan / ED Course  I have reviewed the triage vital signs and the nursing notes.  Pertinent labs &  imaging results that were available during my care of the patient were reviewed by me and considered in my medical decision making (see chart for details). DDX- rash of palms and soles Coxsackie-Presentation, history, and physical exam most consistent with hand-foot and mouth disease rmsf-no other rash, no tick history of unknow CNS symptoms, soft palate lesions, platelets normal  Syphilis-patient is married denies any STDs Janeway lesions/oslers nodes-no significant risk factors for infective endocarditis Kawasaki-clinical correlation Measles no other rash tss no clinical correlation reactiv arthritis  Meningococcemia-no clinical correlation   Clinical Course     Final Clinical Impressions(s) / ED Diagnoses   Final diagnoses:  Hand, foot and mouth disease    New Prescriptions New Prescriptions   No medications on file     Margarita Grizzle, MD 07/10/16 1805

## 2016-07-10 NOTE — ED Triage Notes (Addendum)
Pt. Started taking amoxicillin ON Thursday afternoon  Pt. Has taken a total of 6 tablets and broke out with a rash yesterday.   Located on her hands and feet,  They feel like pins and needles. And she also feels her throat is burning and sore from her strep throat  Her tongue feels rough,.   Pt. Airway is patent she is swallowing without any problems but is painful.  Throat is red,  tongue has tiny bumps on it. RAsh on her hands and soles of her feet is red.  The rash is not on any other body parts

## 2016-07-10 NOTE — ED Triage Notes (Signed)
Pt here b/c she developed a rash yest on both hands/foot  Sx today include: tingling and burning sensation  Also reports she began Amox on Thursday for a pos strep  Denies dyspnea but is having pain when swallowing  A&O x4... NAD... Husband at bedside

## 2016-07-10 NOTE — ED Notes (Signed)
Pt arrvives via shuttle, sent from Urgent Care due to possible Vickie BrunswickStevens Johnsons Syndrome. Pt has rash to hands and feet and started on amoxicillin 3 days ago. She reports last took the Amoxicillin this morning at 5am. RR unlabored, pt in NAD.

## 2016-07-10 NOTE — Discharge Instructions (Signed)
Take benadryl every 6-8 hours as needed for rash. Use tylenol as needed for fever. Drink plenty of fluids. Return if symptoms worsening,especially spreading rash, worsening fever, unable to tolerate fluids.  \ Stop amoxicillin. Recheck with your doctor next week.

## 2016-07-10 NOTE — ED Provider Notes (Signed)
CSN: [id number]     Arrival date & time 07/10/16  1209 History   First MD Initiated Contact with Patient 07/10/16 1331     Chief Complaint  Patient presents with  . Rash   (Consider location/radiation/quality/duration/timing/severity/associated sxs/prior Treatment)  HPI   The patient is a 35 year old female presenting today with complaints of an uncomfortable rash to the bilateral palms of both hands that is now erupting on the soles of her feet. The area is now peeling. Patient states she was recently prescribed amoxicillin for a presumed strep throat infection. Patient denies shortness of breath or difficulty swallowing other than the throat tenderness which she attributes to strept throat.  Denies significant medical history.  States her tongue in the roof of her mouth are itchy and uncomfortable.  Past Medical History:  Diagnosis Date  . Iron deficiency anemia   . PCOS (polycystic ovarian syndrome)    Past Surgical History:  Procedure Laterality Date  . LAPAROSCOPY     History reviewed. No pertinent family history. Social History  Substance Use Topics  . Smoking status: Never Smoker  . Smokeless tobacco: Never Used  . Alcohol use No   OB History    Gravida Para Term Preterm AB Living   0 0 0 0 0 0   SAB TAB Ectopic Multiple Live Births   0 0 0 0       Review of Systems  Constitutional: Negative.  Negative for fatigue and fever.  HENT: Positive for sore throat. Negative for drooling.   Eyes: Negative.   Respiratory: Negative.  Negative for cough, shortness of breath and wheezing.   Cardiovascular: Negative.  Negative for chest pain and leg swelling.  Gastrointestinal: Negative.   Endocrine: Negative.   Genitourinary: Negative.   Musculoskeletal: Negative.  Negative for gait problem and neck stiffness.  Skin: Positive for color change and rash.  Allergic/Immunologic: Negative.        Allergy to latex.  Neurological: Negative.  Negative for dizziness, weakness and  headaches.  Hematological: Negative.   Psychiatric/Behavioral: Negative.     Allergies  Latex  Home Medications   Prior to Admission medications   Medication Sig Start Date End Date Taking? Authorizing Provider  amoxicillin (AMOXIL) 500 MG tablet Take 500 mg by mouth 2 (two) times daily.   Yes Historical Provider, MD  medroxyPROGESTERone (PROVERA) 5 MG tablet Take 5 mg by mouth daily.   Yes Historical Provider, MD  ibuprofen (ADVIL,MOTRIN) 200 MG tablet Take 800 mg by mouth every 6 (six) hours as needed for moderate pain.    Historical Provider, MD  loratadine (ALLERGY RELIEF) 10 MG tablet Take 10 mg by mouth daily as needed for allergies.    Historical Provider, MD  Multiple Vitamins-Iron (MULTIVITAMINS WITH IRON) TABS tablet Take 1 tablet by mouth daily.    Historical Provider, MD  naproxen sodium (ANAPROX) 220 MG tablet Take 220 mg by mouth 2 (two) times daily with a meal.    Historical Provider, MD   Meds Ordered and Administered this Visit  Medications - No data to display  BP 139/83 (BP Location: Left Arm)   Pulse 83   Temp 98.7 F (37.1 C) (Oral)   Resp 16   LMP 06/27/2016   SpO2 100%  No data found.   Physical Exam  Constitutional: She is oriented to person, place, and time. She appears well-developed and well-nourished. No distress.  HENT:  Posterior oropharynx appears beefy red in color but no visible patches present. No evidence  of angioedema or swelling of the tongue.  Eyes: Conjunctivae are normal. Pupils are equal, round, and reactive to light. Right eye exhibits no discharge. Left eye exhibits no discharge. No scleral icterus.  Neck: Normal range of motion. Neck supple. No thyromegaly present.  Cardiovascular: Normal rate, regular rhythm, normal heart sounds and intact distal pulses.  Exam reveals no gallop and no friction rub.   No murmur heard. Pulmonary/Chest: Effort normal and breath sounds normal. No respiratory distress. She has no wheezes. She has no  rales. She exhibits no tenderness.  Neurological: She is alert and oriented to person, place, and time.  Skin: Skin is warm and dry. Capillary refill takes less than 2 seconds. Rash noted. She is not diaphoretic.  Psychiatric: She has a normal mood and affect.  Nursing note and vitals reviewed.   Urgent Care Course   Clinical Course    Procedures (including critical care time)  Labs Review Labs Reviewed - No data to display  Imaging Review No results found.  Plan of care was discussed with Dr. Dayton Scrape and patient to be transferred to [facility] ED for further evaluation - re acute allergic reaction and R/O Stevens-Johnson. MDM   1. Acute allergic reaction, initial encounter     The patient verbalizes understanding and agrees to plan of care.       Servando Salina, NP 07/10/16 1416

## 2016-09-30 DIAGNOSIS — N926 Irregular menstruation, unspecified: Secondary | ICD-10-CM | POA: Diagnosis not present

## 2016-09-30 DIAGNOSIS — Z01411 Encounter for gynecological examination (general) (routine) with abnormal findings: Secondary | ICD-10-CM | POA: Diagnosis not present

## 2016-09-30 DIAGNOSIS — Z01419 Encounter for gynecological examination (general) (routine) without abnormal findings: Secondary | ICD-10-CM | POA: Diagnosis not present

## 2016-09-30 DIAGNOSIS — E282 Polycystic ovarian syndrome: Secondary | ICD-10-CM | POA: Diagnosis not present

## 2016-09-30 DIAGNOSIS — Z6841 Body Mass Index (BMI) 40.0 and over, adult: Secondary | ICD-10-CM | POA: Diagnosis not present

## 2016-11-08 DIAGNOSIS — J069 Acute upper respiratory infection, unspecified: Secondary | ICD-10-CM | POA: Diagnosis not present

## 2017-08-15 DIAGNOSIS — R220 Localized swelling, mass and lump, head: Secondary | ICD-10-CM | POA: Diagnosis not present

## 2017-08-15 DIAGNOSIS — H6505 Acute serous otitis media, recurrent, left ear: Secondary | ICD-10-CM | POA: Diagnosis not present

## 2017-08-16 ENCOUNTER — Other Ambulatory Visit: Payer: Self-pay | Admitting: Internal Medicine

## 2017-08-16 DIAGNOSIS — R220 Localized swelling, mass and lump, head: Secondary | ICD-10-CM

## 2017-08-27 ENCOUNTER — Other Ambulatory Visit: Payer: BLUE CROSS/BLUE SHIELD

## 2017-08-28 ENCOUNTER — Ambulatory Visit
Admission: RE | Admit: 2017-08-28 | Discharge: 2017-08-28 | Disposition: A | Discharge status: Home / Self Care | Payer: Self-pay | Source: Ambulatory Visit | Attending: Internal Medicine | Admitting: Internal Medicine

## 2017-08-28 DIAGNOSIS — R221 Localized swelling, mass and lump, neck: Secondary | ICD-10-CM | POA: Diagnosis not present

## 2017-08-28 DIAGNOSIS — R220 Localized swelling, mass and lump, head: Secondary | ICD-10-CM

## 2017-08-28 MED ORDER — GADOBENATE DIMEGLUMINE 529 MG/ML IV SOLN
20.0000 mL | Freq: Once | INTRAVENOUS | Status: AC | PRN
  Administered 2017-08-28: 20 mL via INTRAVENOUS

## 2017-09-12 ENCOUNTER — Ambulatory Visit (INDEPENDENT_AMBULATORY_CARE_PROVIDER_SITE_OTHER): Payer: BLUE CROSS/BLUE SHIELD | Admitting: Otolaryngology

## 2017-09-12 DIAGNOSIS — K1123 Chronic sialoadenitis: Secondary | ICD-10-CM

## 2017-09-12 DIAGNOSIS — H9202 Otalgia, left ear: Secondary | ICD-10-CM

## 2017-09-13 DIAGNOSIS — Z1151 Encounter for screening for human papillomavirus (HPV): Secondary | ICD-10-CM | POA: Diagnosis not present

## 2017-09-13 DIAGNOSIS — Z6841 Body Mass Index (BMI) 40.0 and over, adult: Secondary | ICD-10-CM | POA: Diagnosis not present

## 2017-09-13 DIAGNOSIS — Z01419 Encounter for gynecological examination (general) (routine) without abnormal findings: Secondary | ICD-10-CM | POA: Diagnosis not present

## 2017-09-13 DIAGNOSIS — E282 Polycystic ovarian syndrome: Secondary | ICD-10-CM | POA: Diagnosis not present

## 2017-09-15 DIAGNOSIS — J019 Acute sinusitis, unspecified: Secondary | ICD-10-CM | POA: Diagnosis not present

## 2017-09-15 DIAGNOSIS — I10 Essential (primary) hypertension: Secondary | ICD-10-CM | POA: Diagnosis not present

## 2017-10-01 DIAGNOSIS — K591 Functional diarrhea: Secondary | ICD-10-CM | POA: Diagnosis not present

## 2018-09-14 DIAGNOSIS — Z1151 Encounter for screening for human papillomavirus (HPV): Secondary | ICD-10-CM | POA: Diagnosis not present

## 2018-09-14 DIAGNOSIS — R3915 Urgency of urination: Secondary | ICD-10-CM | POA: Diagnosis not present

## 2018-09-14 DIAGNOSIS — Z01419 Encounter for gynecological examination (general) (routine) without abnormal findings: Secondary | ICD-10-CM | POA: Diagnosis not present

## 2018-09-14 DIAGNOSIS — Z6841 Body Mass Index (BMI) 40.0 and over, adult: Secondary | ICD-10-CM | POA: Diagnosis not present

## 2019-04-29 IMAGING — MR MR NECK SOFT TISSUE ONLY WO/W CM
5 of 10 series · 19 of 48 positions shown · IV contrast (multihance)
Comparison: None.

CLINICAL DATA: Left facial swelling occurring for 1 year.

Creatinine was obtained on site at [HOSPITAL] at [HOSPITAL].Results: Creatinine 1.2 mg/dL.
EXAM:
MRI OF THE NECK WITH CONTRAST
TECHNIQUE: Multiplanar, multisequence MR imaging was performed following the
administration of intravenous contrast.
CONTRAST:  20 mL MultiHance

[Series 2: T1 · axial · 6.0mm · 0.43mm/px · z∈[-118,+85]mm · 4 of 27 slices shown]
[im 1/27]
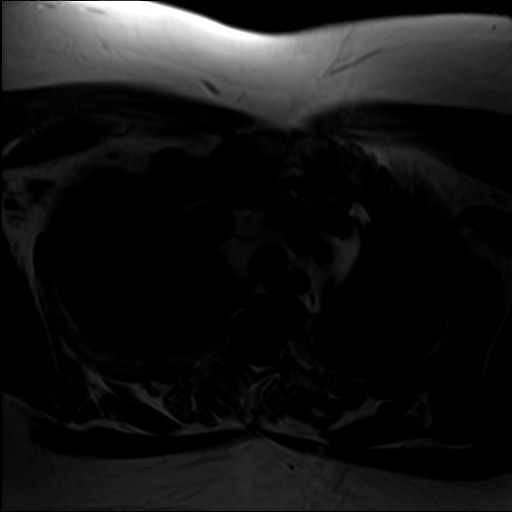
[im 9/27]
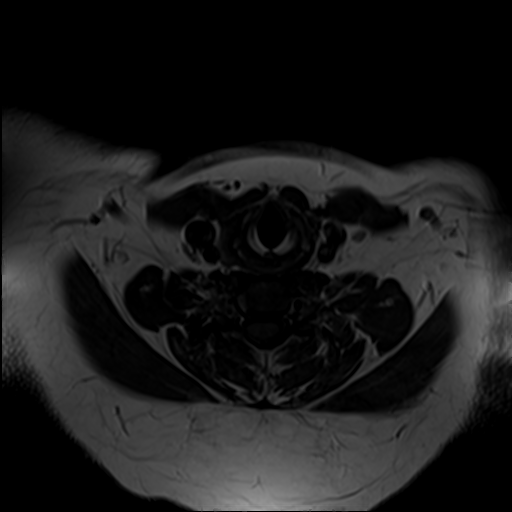
[im 18/27]
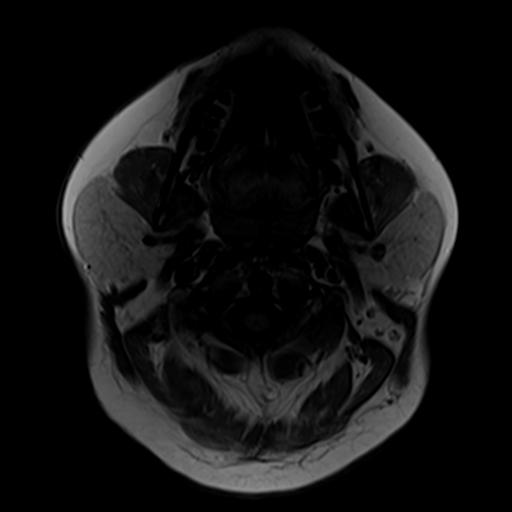
[im 27/27]
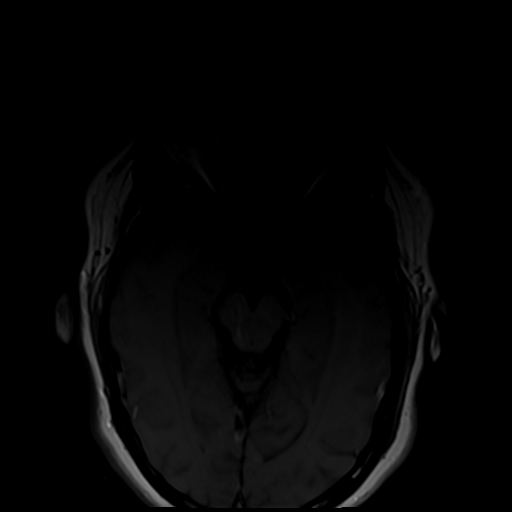

[Series 3: T2 fat-sat · axial · 6.0mm · 0.43mm/px · z∈[-118,+85]mm · 4 of 27 slices shown (1 of 3)]
[im 1/27]
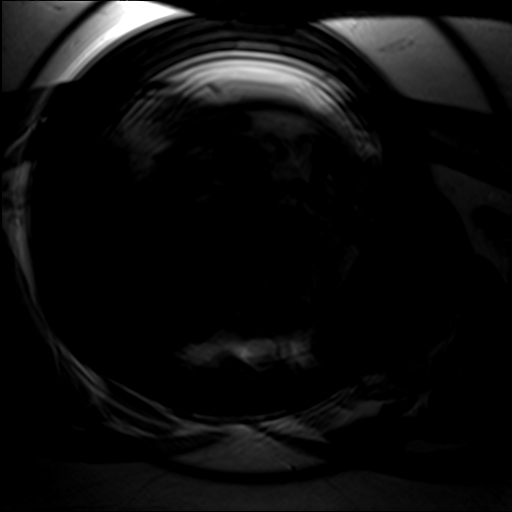
[im 9/27]
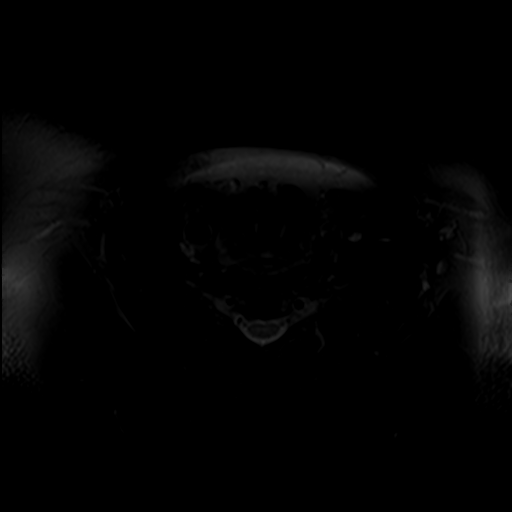
[im 18/27]
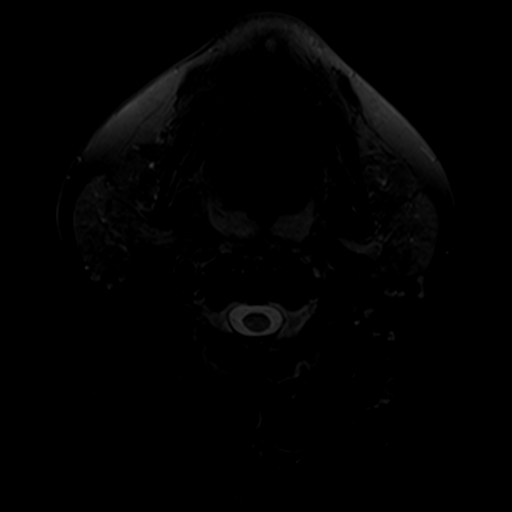
[im 27/27]
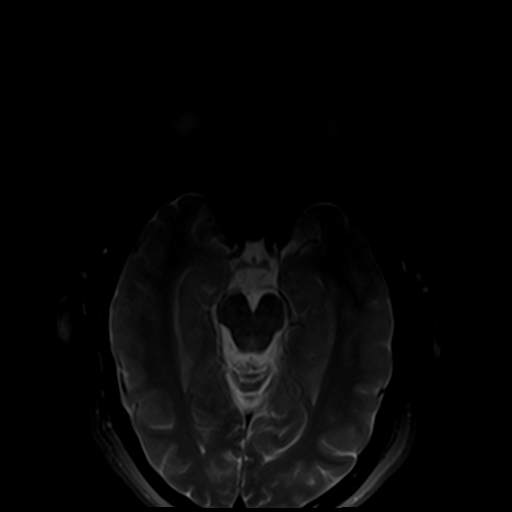

[Series 4: T1 fat-sat · axial · 6.0mm · 0.43mm/px · 1 of 27 slices shown]
[im 1/27]
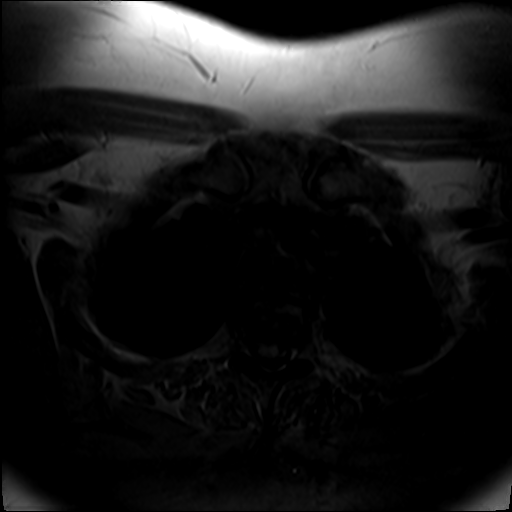

[Series 6: T2 fat-sat · sagittal · 5.0mm · 0.43mm/px · 5 of 27 slices shown (2 of 3)]
[im 1/27]
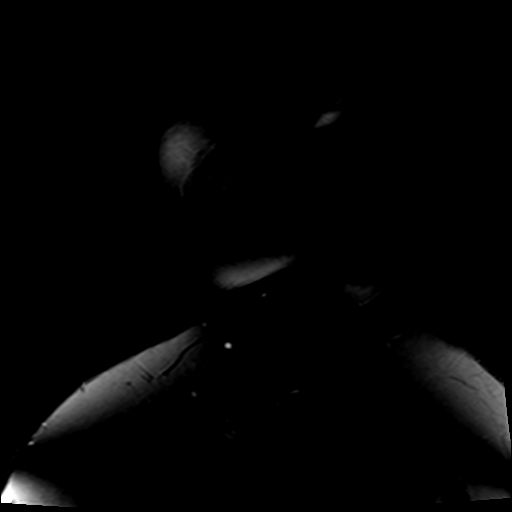
[im 7/27]
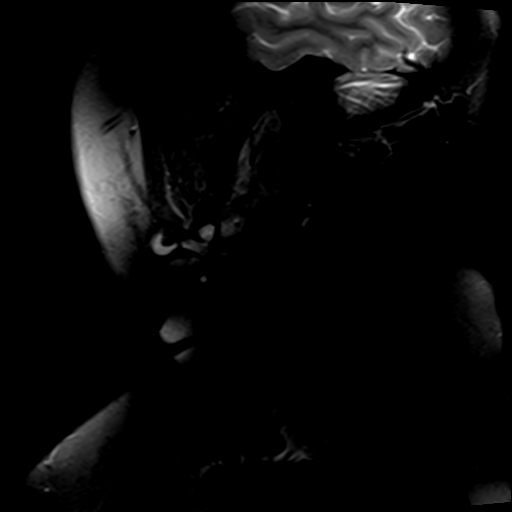
[im 14/27]
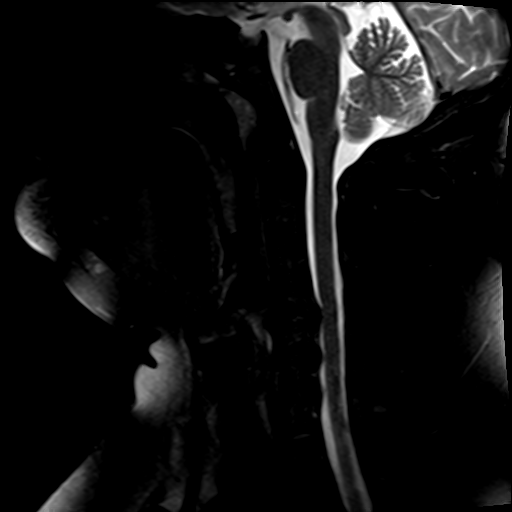
[im 20/27]
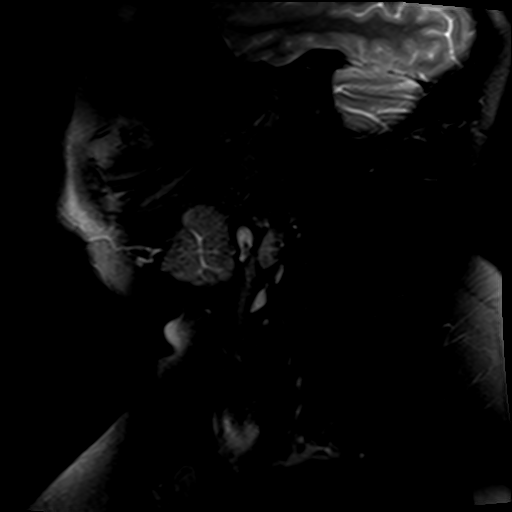
[im 27/27]
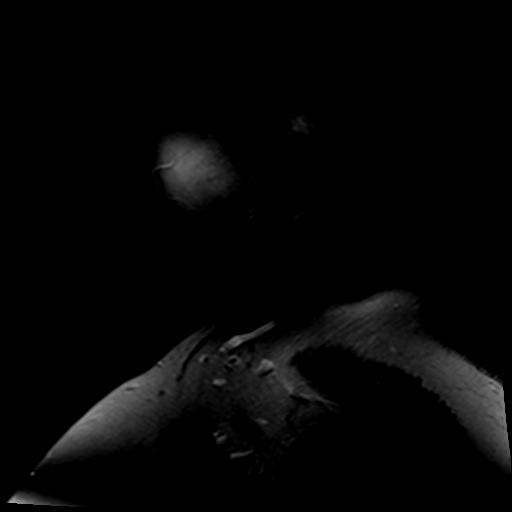

[Series 8: T2 fat-sat · coronal · 4.0mm · 0.43mm/px · 5 of 29 slices shown (3 of 3)]
[im 1/29]
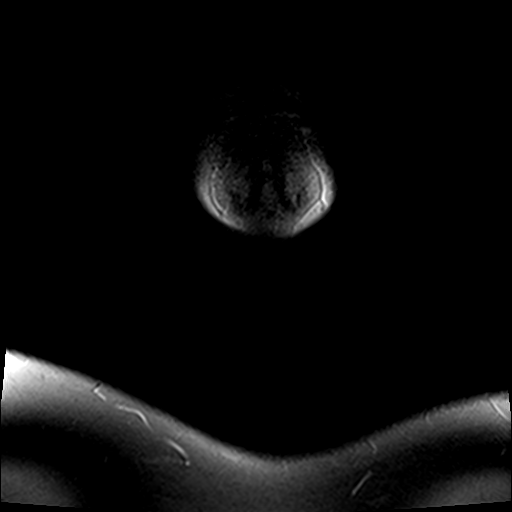
[im 8/29]
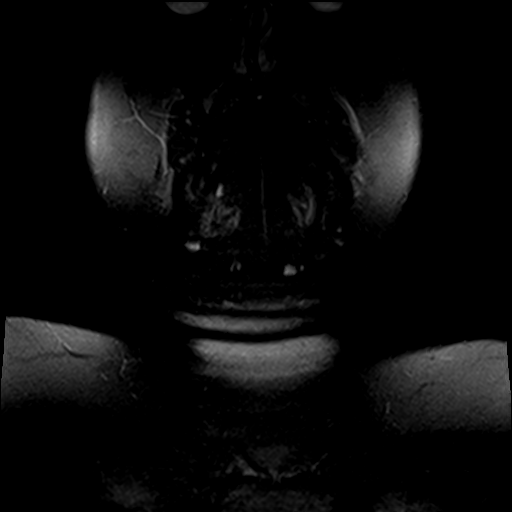
[im 15/29]
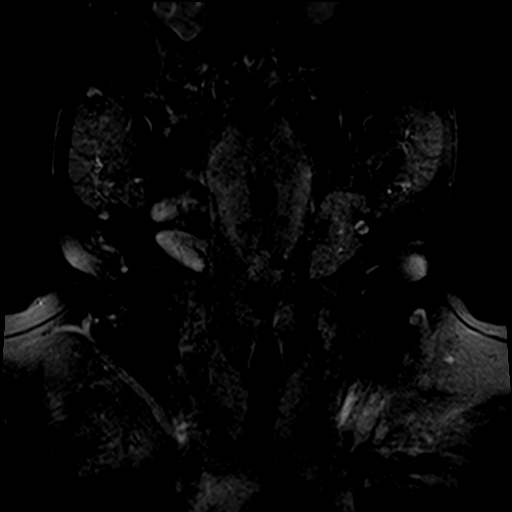
[im 22/29]
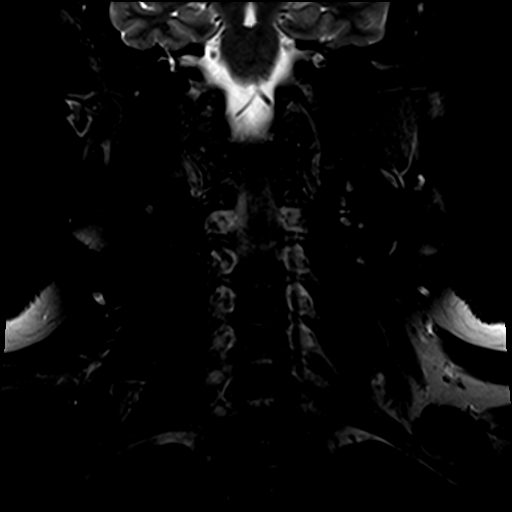
[im 29/29]
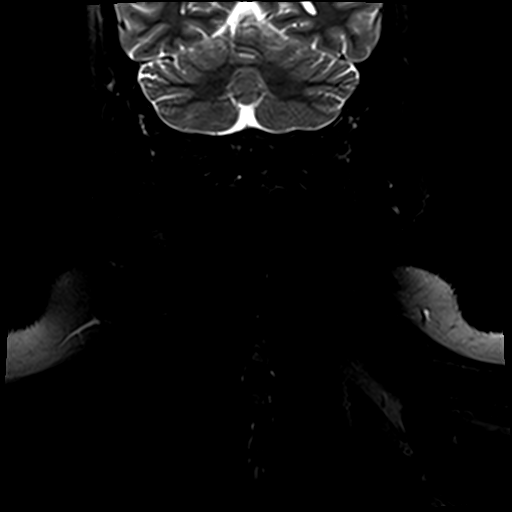

[19 of 48 positions shown; findings below may reference images not displayed]

FINDINGS: Pharynx and larynx: Symmetric pharyngeal soft tissues without
evidence of mass or swelling. Unremarkable larynx. No
retropharyngeal fluid collection.

Salivary glands: No evidence of mass, inflammation, or ductal
dilatation.

Thyroid: Unremarkable.

Lymph nodes: No enlarged or suspicious lymph nodes in the neck.

Vascular: Preserved carotid and vertebral artery flow voids.

Limited intracranial: Unremarkable.

Visualized orbits: Unremarkable.

Mastoids and visualized paranasal sinuses: Clear.

Skeleton: Disc bulging at C4-5 and C5-6 without significant spinal
stenosis. C5-6 disc bulge contacts and minimally indents the spinal
cord.

Upper chest: Unremarkable.

Other: None.
IMPRESSION: No mass, acute inflammation, or other cause of facial swelling
identified.

## 2019-09-13 ENCOUNTER — Other Ambulatory Visit: Payer: Self-pay

## 2019-09-13 DIAGNOSIS — Z20822 Contact with and (suspected) exposure to covid-19: Secondary | ICD-10-CM

## 2019-09-16 LAB — NOVEL CORONAVIRUS, NAA: SARS-CoV-2, NAA: NOT DETECTED

## 2020-01-22 ENCOUNTER — Ambulatory Visit: Payer: BLUE CROSS/BLUE SHIELD

## 2020-01-22 DIAGNOSIS — R002 Palpitations: Secondary | ICD-10-CM

## 2020-02-04 ENCOUNTER — Other Ambulatory Visit: Payer: Self-pay

## 2020-02-04 DIAGNOSIS — R002 Palpitations: Secondary | ICD-10-CM

## 2021-04-06 ENCOUNTER — Ambulatory Visit (HOSPITAL_COMMUNITY)
Admission: EM | Admit: 2021-04-06 | Discharge: 2021-04-06 | Disposition: A | Discharge status: Home / Self Care | Payer: BC Managed Care – PPO

## 2021-04-06 ENCOUNTER — Encounter (HOSPITAL_COMMUNITY): Payer: Self-pay | Admitting: Emergency Medicine

## 2021-04-06 DIAGNOSIS — M5431 Sciatica, right side: Secondary | ICD-10-CM | POA: Diagnosis not present

## 2021-04-06 MED ORDER — NAPROXEN 500 MG PO TABS: 500.0000 mg | ORAL_TABLET | Freq: Two times a day (BID) | ORAL | 0 refills | Status: DC

## 2021-04-06 MED ORDER — METHYLPREDNISOLONE SODIUM SUCC 125 MG IJ SOLR
125.0000 mg | Freq: Once | INTRAMUSCULAR | Status: AC
  Administered 2021-04-06: 125 mg via INTRAMUSCULAR

## 2021-04-06 MED ORDER — METHYLPREDNISOLONE SODIUM SUCC 125 MG IJ SOLR
INTRAMUSCULAR | Status: AC
  Filled 2021-04-06: qty 2

## 2021-04-06 MED ORDER — TIZANIDINE HCL 4 MG PO TABS: 4.0000 mg | ORAL_TABLET | Freq: Three times a day (TID) | ORAL | 0 refills | Status: DC | PRN

## 2021-04-06 NOTE — ED Triage Notes (Signed)
Pt is present today with a shooting pain that starts at her lower right side shooting radiating down her leg. Pt states that she was bending down yesterday to pick something up and that when she felt that shooting pain.

## 2021-04-06 NOTE — ED Provider Notes (Signed)
[facility]   MRN: [id number] DOB: 1980/12/19  Subjective:   Vickie Mason is a 40 y.o. female presenting for acute onset today of severe right-sided low back pain that radiates into the buttock and down her right leg.  Patient states that symptoms started today and the only inciting factor she can think of was that yesterday she was bent cleaning the litter box.  As she rose up she felt sharp shooting pain and has not let up since.  Has been using Tylenol ibuprofen with minimal relief.  Has a history of sciatica and responded very well to steroid injection and is requesting the same today.  No current facility-administered medications for this encounter.  Current Outpatient Medications:  .  amoxicillin (AMOXIL) 500 MG tablet, Take 500 mg by mouth 3 (three) times daily. , Disp: , Rfl:  .  ibuprofen (ADVIL,MOTRIN) 200 MG tablet, Take 800 mg by mouth every 6 (six) hours as needed for moderate pain., Disp: , Rfl:  .  loratadine (CLARITIN) 10 MG tablet, Take 10 mg by mouth daily as needed for allergies., Disp: , Rfl:  .  medroxyPROGESTERone (PROVERA) 10 MG tablet, Take 10 mg by mouth See admin instructions. Takes from 19th-28th of each month only, Disp: , Rfl: 7 .  Multiple Vitamins-Iron (MULTIVITAMINS WITH IRON) TABS tablet, Take 1 tablet by mouth daily as needed (takes occassionally). , Disp: , Rfl:    Allergies  Allergen Reactions  . Latex Dermatitis    Past Medical History:  Diagnosis Date  . Iron deficiency anemia   . PCOS (polycystic ovarian syndrome)      Past Surgical History:  Procedure Laterality Date  . LAPAROSCOPY      History reviewed. No pertinent family history.  Social History   Tobacco Use  . Smoking status: Never Smoker  . Smokeless tobacco: Never Used  Substance Use Topics  . Alcohol use: No  . Drug use: No    ROS   Objective:   Vitals: BP (!) 143/69 (BP Location: Right Wrist)   Pulse 70   Temp 98.5 F (36.9 C)   Resp 18    SpO2 99%   Physical Exam Constitutional:      General: She is not in acute distress.    Appearance: Normal appearance. She is well-developed. She is not ill-appearing, toxic-appearing or diaphoretic.  HENT:     Head: Normocephalic and atraumatic.     Nose: Nose normal.     Mouth/Throat:     Mouth: Mucous membranes are moist.     Pharynx: Oropharynx is clear.  Eyes:     General: No scleral icterus.       Right eye: No discharge.        Left eye: No discharge.     Extraocular Movements: Extraocular movements intact.     Conjunctiva/sclera: Conjunctivae normal.     Pupils: Pupils are equal, round, and reactive to light.  Cardiovascular:     Rate and Rhythm: Normal rate.  Pulmonary:     Effort: Pulmonary effort is normal.  Musculoskeletal:     Lumbar back: Spasms and tenderness present. No swelling, edema, deformity, signs of trauma, lacerations or bony tenderness. Normal range of motion. Positive right straight leg raise test. Negative left straight leg raise test. No scoliosis.       Back:  Skin:    General: Skin is warm and dry.  Neurological:     General: No focal deficit present.     Mental  Status: She is alert and oriented to person, place, and time.     Motor: No weakness.     Coordination: Coordination normal.     Gait: Gait normal.     Deep Tendon Reflexes: Reflexes normal.  Psychiatric:        Mood and Affect: Mood normal.        Behavior: Behavior normal.        Thought Content: Thought content normal.        Judgment: Judgment normal.       Assessment and Plan :   PDMP not reviewed this encounter.  1. Sciatica of right side     IM Solu-Medrol in clinic.  Use naproxen and tizanidine otherwise.  Counseled on back care. Counseled patient on potential for adverse effects with medications prescribed/recommended today, ER and return-to-clinic precautions discussed, patient verbalized understanding.    Wallis Bamberg, [place] 04/06/21 [phone]

## 2023-12-08 NOTE — Progress Notes (Signed)
CARDIOLOGY CONSULT NOTE       Patient ID: Vickie Mason MRN: [id number] DOB/AGE: [birth year] 43 y.o.   Referring Physician: Cheron Every FNP Primary Physician: Carylon Perches, MD Primary Cardiologist: New Reason for Consultation: Family history of HOCM    HPI:  43 y.o. referred by Malva Limes FNP for family history of HOCM.  She has history of OsA/CPAP,  GAD, HTN, Anemia and PCOS. Obesity Rx with [employer] at one point but lost insurance coverage and gained weight back. Intolerant to Qsymia with fatigue. Maternal aunt/uncle with HOCM No details ? Mother as well She has some exertional dyspnea   She is a Management consultant. Sedentary Gained weight after hysterectomy and unable to get [facility] with [employer]. Married 10 years to native [place] She is from [place] Has dog/cat at home  Diet :  she likes sweets too much  No chest pain , or pre syncope Mild exertional dyspnea more likely from deconditionng   ROS All other systems reviewed and negative except as noted above  Past Medical History:  Diagnosis Date   Anxiety    HTN (hypertension)    Iron deficiency anemia    OSA on CPAP    PCOS (polycystic ovarian syndrome)     Family History  Problem Relation Age of Onset   Stroke Mother    Diabetes Mother    Kidney disease Mother    Thyroid disease Mother    Thyroid disease Father     Social History   Socioeconomic History   Marital status: Married    Spouse name: Not on file   Number of children: Not on file   Years of education: Not on file   Highest education level: Not on file  Occupational History   Not on file  Tobacco Use   Smoking status: Never   Smokeless tobacco: Never  Substance and Sexual Activity   Alcohol use: No   Drug use: No   Sexual activity: Yes    Birth control/protection: None  Other Topics Concern   Not on file  Social History Narrative   Not on file   Social Drivers of Health   Financial Resource Strain: Not on file   Food Insecurity: No Food Insecurity (12/31/2020)   Received from [facility]   Hunger Vital Sign    Worried About Running Out of Food in the Last Year: Never true    Ran Out of Food in the Last Year: Never true  Transportation Needs: Not on file  Physical Activity: Not on file  Stress: Not on file  Social Connections: Unknown (03/16/2022)   Received from [facility]   Social Network    Social Network: Not on file  Intimate Partner Violence: Unknown (02/05/2022)   Received from [facility]   HITS    Physically Hurt: Not on file    Insult or Talk Down To: Not on file    Threaten Physical Harm: Not on file    Scream or Curse: Not on file    Past Surgical History:  Procedure Laterality Date   LAPAROSCOPIC HYSTERECTOMY     LAPAROSCOPIC OOPHERECTOMY     LAPAROSCOPY     NASAL SEPTUM SURGERY        Current Outpatient Medications:    estradiol (ESTRACE) 2 MG tablet, Take 2 mg by mouth daily., Disp: , Rfl:    Ascorbic Acid 100 MG CHEW, Chew by mouth., Disp: , Rfl:    BIOTIN PO, Take by mouth., Disp: , Rfl:  ibuprofen (ADVIL,MOTRIN) 200 MG tablet, Take 800 mg by mouth every 6 (six) hours as needed for moderate pain., Disp: , Rfl:    metoprolol succinate (TOPROL-XL) 50 MG 24 hr tablet, Take by mouth., Disp: , Rfl:    Multiple Vitamins-Iron (MULTIVITAMINS WITH IRON) TABS tablet, Take 1 tablet by mouth daily as needed (takes occassionally). , Disp: , Rfl:    PROBIOTIC PRODUCT PO, Take by mouth., Disp: , Rfl:     Physical Exam: Blood pressure 134/86, pulse 73, height 5\' 9"  (1.753 m), weight (!) 313 lb (142 kg), SpO2 99%.    Affect appropriate Healthy:  appears stated age HEENT: normal Neck supple with no adenopathy JVP normal no bruits no thyromegaly Lungs clear with no wheezing and good diaphragmatic motion Heart:  S1/S2 no murmur, no rub, gallop or click PMI normal Abdomen: benighn, BS positve, no tenderness, no AAA no bruit.  No HSM or HJR Prior hysterectomy  Distal  pulses intact with no bruits No edema Neuro non-focal Skin warm and dry No muscular weakness   Labs:   Lab Results  Component Value Date   WBC 7.0 07/10/2016   HGB 14.0 07/10/2016   HCT 45.5 07/10/2016   MCV 82.3 07/10/2016   PLT 309 07/10/2016   No results for input(s): "NA", "K", "CL", "CO2", "BUN", "CREATININE", "CALCIUM", "PROT", "BILITOT", "ALKPHOS", "ALT", "AST", "GLUCOSE" in the last 168 hours.  Invalid input(s): "LABALBU" No results found for: "CKTOTAL", "CKMB", "CKMBINDEX", "TROPONINI" No results found for: "CHOL" No results found for: "HDL" No results found for: "LDLCALC" No results found for: "TRIG" No results found for: "CHOLHDL" No results found for: "LDLDIRECT"    Radiology: No results found.  EKG: SR rate 71 normal 12/16/2023     ASSESSMENT AND PLAN:   Family History HOCM:  some exertional dyspnea more likely from obesity.  TTE ordered ECG is normal and no murmur on exam so not likely to have HOCM HTN:  continue beta blocker   Obesity:  discussed low carb diet and exercise   TTE  F/U PRN pending results of TTE   Signed: Charlton Haws 12/16/2023, 2:45 PM

## 2023-12-16 ENCOUNTER — Ambulatory Visit: Payer: 59 | Attending: Cardiovascular Disease | Admitting: Cardiovascular Disease

## 2023-12-16 ENCOUNTER — Encounter: Payer: Self-pay | Admitting: Cardiovascular Disease

## 2023-12-16 VITALS — BP 134/86 | HR 73 | Ht 69.0 in | Wt 313.0 lb

## 2023-12-16 DIAGNOSIS — Z8249 Family history of ischemic heart disease and other diseases of the circulatory system: Secondary | ICD-10-CM | POA: Diagnosis not present

## 2023-12-16 DIAGNOSIS — I10 Essential (primary) hypertension: Secondary | ICD-10-CM | POA: Diagnosis not present

## 2023-12-16 DIAGNOSIS — Z7689 Persons encountering health services in other specified circumstances: Secondary | ICD-10-CM | POA: Diagnosis not present

## 2023-12-16 NOTE — Patient Instructions (Signed)
Medication Instructions:  Your physician recommends that you continue on your current medications as directed. Please refer to the Current Medication list given to you today.  *If you need a refill on your cardiac medications before your next appointment, please call your pharmacy*  Lab Work: If you have labs (blood work) drawn today and your tests are completely normal, you will receive your results only by: MyChart Message (if you have MyChart) OR A paper copy in the mail If you have any lab test that is abnormal or we need to change your treatment, we will call you to review the results.  Testing/Procedures: Your physician has requested that you have an echocardiogram. Echocardiography is a painless test that uses sound waves to create images of your heart. It provides your doctor with information about the size and shape of your heart and how well your heart's chambers and valves are working. This procedure takes approximately one hour. There are no restrictions for this procedure. Please do NOT wear cologne, perfume, aftershave, or lotions (deodorant is allowed). Please arrive 15 minutes prior to your appointment time.  Please note: We ask at that you not bring children with you during ultrasound (echo/ vascular) testing. Due to room size and safety concerns, children are not allowed in the ultrasound rooms during exams. Our front office staff cannot provide observation of children in our lobby area while testing is being conducted. An adult accompanying a patient to their appointment will only be allowed in the ultrasound room at the discretion of the ultrasound technician under special circumstances. We apologize for any inconvenience. Follow-Up: At [facility], you and your health needs are our priority.  As part of our continuing mission to provide you with exceptional heart care, we have created designated Provider Care Teams.  These Care Teams include your primary Cardiologist  (physician) and Advanced Practice Providers (APPs -  Physician Assistants and Nurse Practitioners) who all work together to provide you with the care you need, when you need it.  We recommend signing up for the patient portal called "MyChart".  Sign up information is provided on this After Visit Summary.  MyChart is used to connect with patients for Virtual Visits (Telemedicine).  Patients are able to view lab/test results, encounter notes, upcoming appointments, etc.  Non-urgent messages can be sent to your provider as well.   To learn more about what you can do with MyChart, go to [email or web address].    Your next appointment:   As needed  Provider:   Charlton Haws, MD     Other Instructions    1st Floor: - Lobby - Registration  - Pharmacy  - Lab - Cafe  2nd Floor: - PV Lab - Diagnostic Testing (echo, CT, nuclear med)  3rd Floor: - Vacant  4th Floor: - TCTS (cardiothoracic surgery) - AFib Clinic - Structural Heart Clinic - Vascular Surgery  - Vascular Ultrasound  5th Floor: - [facility] (general and EP) - Clinical Pharmacy for coumadin, hypertension, lipid, weight-loss medications, and med management appointments    Valet parking services will be available as well.

## 2024-01-06 ENCOUNTER — Ambulatory Visit (HOSPITAL_COMMUNITY): Payer: 59 | Attending: Cardiovascular Disease

## 2024-01-06 DIAGNOSIS — Z7689 Persons encountering health services in other specified circumstances: Secondary | ICD-10-CM | POA: Diagnosis present

## 2024-01-06 DIAGNOSIS — Z8249 Family history of ischemic heart disease and other diseases of the circulatory system: Secondary | ICD-10-CM | POA: Diagnosis present

## 2024-01-06 DIAGNOSIS — I10 Essential (primary) hypertension: Secondary | ICD-10-CM | POA: Insufficient documentation

## 2024-01-06 LAB — ECHOCARDIOGRAM COMPLETE
Area-P 1/2: 3.53 cm2
S' Lateral: 3.2 cm
